# Patient Record
Sex: Female | Born: 1940 | Race: White | Hispanic: Yes
Health system: Midwestern US, Academic
[De-identification: ages and names within clinical notes are randomized; demographics above are authoritative.]

---

## 2017-08-03 ENCOUNTER — Encounter: Admit: 2017-08-03 | Discharge: 2017-08-03 | Payer: BC Managed Care – PPO | Primary: "Endocrinology

## 2017-08-03 DIAGNOSIS — Z1239 Encounter for other screening for malignant neoplasm of breast: Principal | ICD-10-CM

## 2017-08-07 ENCOUNTER — Ambulatory Visit: Admit: 2017-08-07 | Discharge: 2017-08-07 | Payer: BC Managed Care – PPO | Primary: "Endocrinology

## 2017-08-07 DIAGNOSIS — Z1239 Encounter for other screening for malignant neoplasm of breast: ICD-10-CM

## 2017-08-07 DIAGNOSIS — Z1231 Encounter for screening mammogram for malignant neoplasm of breast: Principal | ICD-10-CM

## 2018-07-23 ENCOUNTER — Encounter: Admit: 2018-07-23 | Discharge: 2018-07-23 | Payer: BC Managed Care – PPO | Primary: "Endocrinology

## 2018-07-23 DIAGNOSIS — Z1231 Encounter for screening mammogram for malignant neoplasm of breast: Principal | ICD-10-CM

## 2018-08-09 ENCOUNTER — Ambulatory Visit: Admit: 2018-08-09 | Discharge: 2018-08-09 | Payer: BC Managed Care – PPO | Primary: "Endocrinology

## 2018-08-09 DIAGNOSIS — Z1231 Encounter for screening mammogram for malignant neoplasm of breast: Principal | ICD-10-CM

## 2019-09-02 ENCOUNTER — Encounter: Admit: 2019-09-02 | Discharge: 2019-09-02 | Payer: BC Managed Care – PPO | Primary: "Endocrinology

## 2019-09-02 DIAGNOSIS — Z1231 Encounter for screening mammogram for malignant neoplasm of breast: Secondary | ICD-10-CM

## 2019-09-23 ENCOUNTER — Ambulatory Visit: Admit: 2019-09-23 | Discharge: 2019-09-23 | Payer: BC Managed Care – PPO | Primary: "Endocrinology

## 2019-09-23 ENCOUNTER — Encounter: Admit: 2019-09-23 | Discharge: 2019-09-23 | Payer: BC Managed Care – PPO | Primary: "Endocrinology

## 2019-09-23 DIAGNOSIS — Z1231 Encounter for screening mammogram for malignant neoplasm of breast: Secondary | ICD-10-CM

## 2020-09-17 ENCOUNTER — Encounter: Admit: 2020-09-17 | Discharge: 2020-09-17 | Payer: BC Managed Care – PPO | Primary: "Endocrinology

## 2020-09-17 DIAGNOSIS — Z1231 Encounter for screening mammogram for malignant neoplasm of breast: Secondary | ICD-10-CM

## 2020-11-21 ENCOUNTER — Ambulatory Visit: Admit: 2020-11-21 | Discharge: 2020-11-21 | Payer: BC Managed Care – PPO | Primary: "Endocrinology

## 2020-11-21 ENCOUNTER — Encounter: Admit: 2020-11-21 | Discharge: 2020-11-21 | Payer: BC Managed Care – PPO | Primary: "Endocrinology

## 2020-11-21 DIAGNOSIS — Z1231 Encounter for screening mammogram for malignant neoplasm of breast: Secondary | ICD-10-CM

## 2021-01-03 ENCOUNTER — Encounter: Admit: 2021-01-03 | Discharge: 2021-01-03 | Payer: BC Managed Care – PPO | Primary: "Endocrinology

## 2021-01-03 DIAGNOSIS — R922 Inconclusive mammogram: Secondary | ICD-10-CM

## 2021-01-14 ENCOUNTER — Ambulatory Visit: Admit: 2021-01-14 | Discharge: 2021-01-14 | Payer: BC Managed Care – PPO | Primary: "Endocrinology

## 2021-01-14 ENCOUNTER — Encounter: Admit: 2021-01-14 | Discharge: 2021-01-14 | Payer: BC Managed Care – PPO | Primary: "Endocrinology

## 2021-01-14 DIAGNOSIS — R922 Inconclusive mammogram: Secondary | ICD-10-CM

## 2021-03-15 ENCOUNTER — Encounter: Admit: 2021-03-15 | Discharge: 2021-03-15 | Payer: BC Managed Care – PPO | Primary: "Endocrinology

## 2021-03-15 DIAGNOSIS — E119 Type 2 diabetes mellitus without complications: Secondary | ICD-10-CM

## 2021-03-15 DIAGNOSIS — E785 Hyperlipidemia, unspecified: Secondary | ICD-10-CM

## 2021-03-15 DIAGNOSIS — I10 Essential (primary) hypertension: Secondary | ICD-10-CM

## 2022-01-10 ENCOUNTER — Encounter: Admit: 2022-01-10 | Discharge: 2022-01-10 | Payer: BC Managed Care – PPO | Primary: "Endocrinology

## 2022-01-10 DIAGNOSIS — Z1231 Encounter for screening mammogram for malignant neoplasm of breast: Secondary | ICD-10-CM

## 2022-02-19 ENCOUNTER — Encounter: Admit: 2022-02-19 | Discharge: 2022-02-19 | Payer: BC Managed Care – PPO | Primary: "Endocrinology

## 2022-02-19 ENCOUNTER — Ambulatory Visit: Admit: 2022-02-19 | Discharge: 2022-02-19 | Payer: BC Managed Care – PPO | Primary: "Endocrinology

## 2022-02-19 DIAGNOSIS — Z1231 Encounter for screening mammogram for malignant neoplasm of breast: Secondary | ICD-10-CM

## 2022-02-28 ENCOUNTER — Encounter: Admit: 2022-02-28 | Discharge: 2022-02-28 | Payer: BC Managed Care – PPO | Primary: "Endocrinology

## 2022-02-28 DIAGNOSIS — R928 Other abnormal and inconclusive findings on diagnostic imaging of breast: Secondary | ICD-10-CM

## 2022-03-18 ENCOUNTER — Ambulatory Visit: Admit: 2022-03-18 | Discharge: 2022-03-18 | Payer: BC Managed Care – PPO | Primary: "Endocrinology

## 2022-03-18 ENCOUNTER — Encounter: Admit: 2022-03-18 | Discharge: 2022-03-18 | Payer: BC Managed Care – PPO | Primary: "Endocrinology

## 2022-03-18 DIAGNOSIS — R928 Other abnormal and inconclusive findings on diagnostic imaging of breast: Secondary | ICD-10-CM

## 2022-03-18 NOTE — Progress Notes
I spoke with Marisa Sexton (her full name and date of birth were verified) this morning in [facility] in regards to the Radiologist recommending that she have a breast ultrasound-guided biopsy.   She was educated on how to prepare for the biopsy, and what to expect during and after the procedure.   She currently is not taking a daily aspirin or any anti-coagulants.  Her procedure has been scheduled for 03/24/2022.  I reviewed with her that she needed to be at [facility] 1 hour prior to the appointment time.  She verbalized understanding in regards to all instructions and did not have any questions.  She was given an appointment card with [facility] contact information.

## 2022-03-25 ENCOUNTER — Ambulatory Visit: Admit: 2022-03-25 | Discharge: 2022-03-25 | Payer: BC Managed Care – PPO

## 2022-03-25 ENCOUNTER — Encounter: Admit: 2022-03-25 | Discharge: 2022-03-25 | Payer: BC Managed Care – PPO

## 2022-03-25 DIAGNOSIS — N63 Unspecified lump in unspecified breast: Secondary | ICD-10-CM

## 2022-03-25 MED ORDER — LIDOCAINE 1%-EPINEPHRINE 1:100000 (BUFFERED) VIAL (BREAST CENTER)
4 mL | Freq: Once | INTRAMUSCULAR | 0 refills | Status: CP
Start: 2022-03-25 — End: ?

## 2022-03-25 MED ORDER — LIDOCAINE 1% (BUFFERED) SYRINGE (BREAST CENTER)
1 mL | Freq: Once | INTRAMUSCULAR | 0 refills | Status: CP
Start: 2022-03-25 — End: ?

## 2022-03-25 NOTE — Progress Notes
Marisa Sexton is here for a right ultrasound guided breast biopsy today. She has been educated, risks of procedure were discussed and her questions answered prior to signing procedure consent with Dr. Estill Bakes and this nurse present.

## 2022-04-01 ENCOUNTER — Encounter: Admit: 2022-04-01 | Discharge: 2022-04-01 | Payer: BC Managed Care – PPO

## 2022-04-01 NOTE — Progress Notes
I spoke with Marisa Sexton this afternoon on the phone (her full name and date of birth were verified) and informed her of the Radiologists [facility] report and future breast imaging recommendations from her breast biopsy on 03/25/2022.  Radiologist has recommended that she have a 6 month follow up right breast diagnostic mammogram and ultrasound.  She was agreeable to this and I gave her the scheduling number to make this appointment.  She verbalized understanding and did not have any questions.

## 2022-08-12 ENCOUNTER — Encounter: Admit: 2022-08-12 | Discharge: 2022-08-12 | Payer: BC Managed Care – PPO

## 2022-08-12 DIAGNOSIS — R928 Other abnormal and inconclusive findings on diagnostic imaging of breast: Secondary | ICD-10-CM

## 2022-09-05 ENCOUNTER — Encounter: Admit: 2022-09-05 | Discharge: 2022-09-05 | Payer: BC Managed Care – PPO

## 2022-10-30 ENCOUNTER — Ambulatory Visit: Admit: 2022-10-30 | Discharge: 2022-10-30 | Payer: BC Managed Care – PPO

## 2022-10-30 ENCOUNTER — Encounter: Admit: 2022-10-30 | Discharge: 2022-10-30 | Payer: BC Managed Care – PPO

## 2022-10-30 DIAGNOSIS — E785 Hyperlipidemia, unspecified: Secondary | ICD-10-CM

## 2022-10-30 DIAGNOSIS — I10 Essential (primary) hypertension: Secondary | ICD-10-CM

## 2022-10-30 DIAGNOSIS — E119 Type 2 diabetes mellitus without complications: Secondary | ICD-10-CM

## 2022-10-30 DIAGNOSIS — R928 Other abnormal and inconclusive findings on diagnostic imaging of breast: Secondary | ICD-10-CM

## 2023-03-16 ENCOUNTER — Encounter: Admit: 2023-03-16 | Discharge: 2023-03-16 | Payer: BC Managed Care – PPO

## 2023-03-16 DIAGNOSIS — Z1231 Encounter for screening mammogram for malignant neoplasm of breast: Secondary | ICD-10-CM

## 2023-03-19 ENCOUNTER — Encounter: Admit: 2023-03-19 | Discharge: 2023-03-19 | Payer: BC Managed Care – PPO

## 2023-03-19 ENCOUNTER — Ambulatory Visit: Admit: 2023-03-19 | Discharge: 2023-03-19 | Payer: MEDICARE

## 2023-03-19 ENCOUNTER — Encounter: Admit: 2023-03-19 | Discharge: 2023-03-19 | Payer: MEDICARE

## 2023-03-19 DIAGNOSIS — I10 Essential (primary) hypertension: Secondary | ICD-10-CM

## 2023-03-19 DIAGNOSIS — E119 Type 2 diabetes mellitus without complications: Secondary | ICD-10-CM

## 2023-03-19 DIAGNOSIS — Z1231 Encounter for screening mammogram for malignant neoplasm of breast: Secondary | ICD-10-CM

## 2023-03-19 DIAGNOSIS — E785 Hyperlipidemia, unspecified: Secondary | ICD-10-CM

## 2023-08-30 IMAGING — MR SHOULDRTWO
4 of 9 series · 15 of 40 positions shown · non-contrast
Comparison: none

[Series 6: T2 fat-sat · axial · right · 4.0mm · 0.58mm/px · z∈[-27,+73]mm · 4 of 24 slices shown (1 of 2)]
[im 1/24]
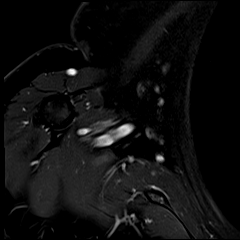
[im 8/24]
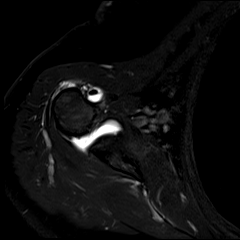
[im 16/24]
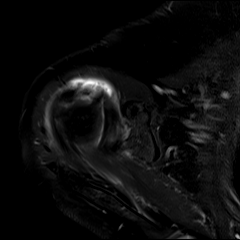
[im 24/24]
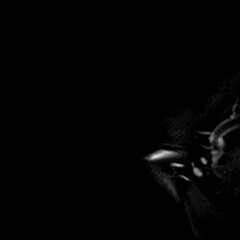

[Series 7: STIR · coronal · right · 4.0mm · 0.62mm/px · 4 of 26 slices shown]
[im 1/26]
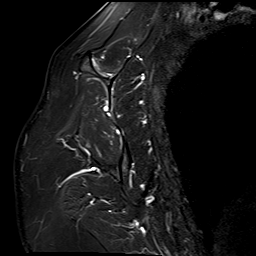
[im 9/26]
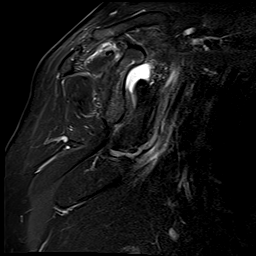
[im 17/26]
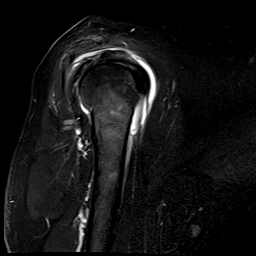
[im 26/26]
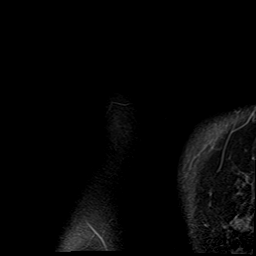

[Series 8: T1 · coronal · right · 4.0mm · 0.48mm/px · 4 of 26 slices shown]
[im 1/26]
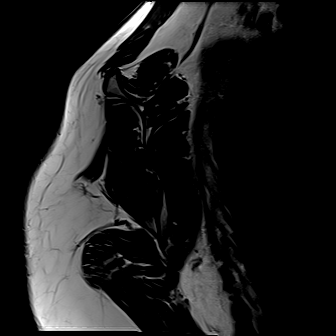
[im 9/26]
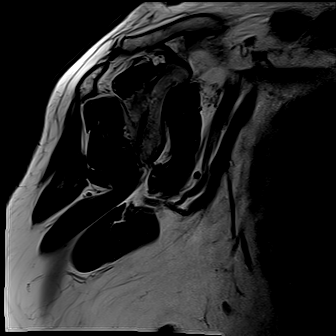
[im 17/26]
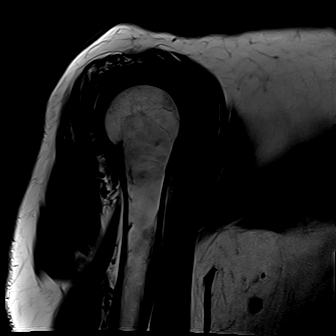
[im 26/26]
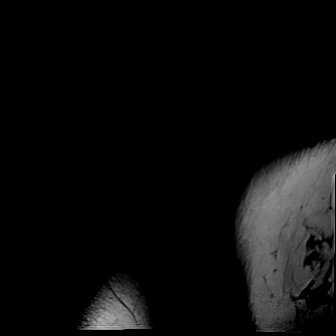

[Series 9: T2 fat-sat · oblique · right · 4.0mm · 0.50mm/px · 3 of 23 slices shown (2 of 2)]
[im 1/23]
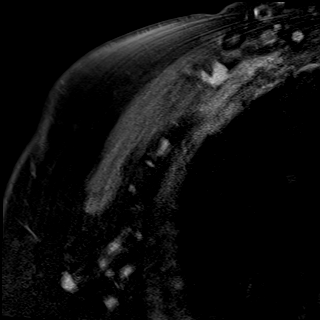
[im 12/23]
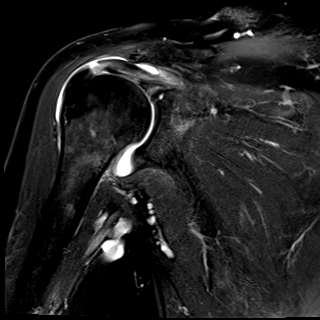
[im 23/23]
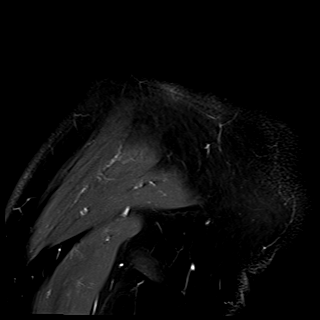

[15 of 40 positions shown; findings below may reference images not displayed]

DIAGNOSTIC STUDIES

EXAM

MR shoulder RT wo con

INDICATION

rt shoulder pain
RT SHOULDER PAIN AFTER FALL [DATE] WITH VERY LIMITED ROM. RG

TECHNIQUE

Oblique coronal, oblique sagittal, and axial images were obtained with variable T1 and T2 weighting.

COMPARISONS

None available

FINDINGS

There is a 5 millimeter full-thickness tear of the anterior rotator cuff tendon 1 cm proximal to its
insertion on the greater tuberosity. This predominately involves fibers of the supraspinatus. There
is mild muscular edema within the supraspinatus. No significant atrophy is seen.

The sub scapularis tendon demonstrates mild tendinosis/tendinopathy. The bicipital tendon also
demonstrates mild tendinosis/tendinopathy.

There is increased T2 signal within the labrum concerning for superior labral tear. Possible small
posterior labral tear is also seen.

Shoulder effusion is noted.

IMPRESSION

Small 5 millimeter full-thickness tear of the anterior rotator cuff tendon.

Mild tendinosis/tendinopathy of the sub scapularis and bicipital tendons.

Probable superior labral tear and possible posterior labral tear. The exact age is indeterminate.

Shoulder effusion.

Tech Notes:

RT SHOULDER PAIN AFTER FALL [DATE] WITH VERY LIMITED ROM.  RG

## 2024-11-08 ENCOUNTER — Encounter: Admit: 2024-11-08 | Discharge: 2024-11-08 | Payer: MEDICARE
